# Patient Record
Sex: Male | Born: 1985 | Race: White | Hispanic: No | Marital: Single | Smoking: Current every day smoker
Health system: Southern US, Community
[De-identification: ages and names within clinical notes are randomized; demographics above are authoritative.]

## PROBLEM LIST (undated history)

## (undated) DIAGNOSIS — Z8701 Personal history of pneumonia (recurrent): Secondary | ICD-10-CM

## (undated) DIAGNOSIS — J45909 Unspecified asthma, uncomplicated: Secondary | ICD-10-CM

## (undated) DIAGNOSIS — H7090 Unspecified mastoiditis, unspecified ear: Secondary | ICD-10-CM

## (undated) DIAGNOSIS — J309 Allergic rhinitis, unspecified: Secondary | ICD-10-CM

## (undated) HISTORY — DX: Allergic rhinitis, unspecified: J30.9

## (undated) HISTORY — DX: Unspecified mastoiditis, unspecified ear: H70.90

## (undated) HISTORY — DX: Unspecified asthma, uncomplicated: J45.909

## (undated) HISTORY — DX: Personal history of pneumonia (recurrent): Z87.01

---

## 2011-12-10 ENCOUNTER — Emergency Department: Payer: Self-pay | Admitting: *Deleted

## 2013-03-24 ENCOUNTER — Emergency Department: Payer: Self-pay | Admitting: Emergency Medicine

## 2013-03-24 LAB — URINALYSIS, COMPLETE
Bilirubin,UR: NEGATIVE
Glucose,UR: NEGATIVE mg/dL (ref 0–75)
Ketone: NEGATIVE
Leukocyte Esterase: NEGATIVE
Ph: 7 (ref 4.5–8.0)
RBC,UR: NONE SEEN /HPF (ref 0–5)
Specific Gravity: 1.023 (ref 1.003–1.030)
Squamous Epithelial: NONE SEEN
WBC UR: NONE SEEN /HPF (ref 0–5)

## 2013-03-24 LAB — CBC
HGB: 16.8 g/dL (ref 13.0–18.0)
MCH: 33.4 pg (ref 26.0–34.0)
MCV: 95 fL (ref 80–100)
Platelet: 168 10*3/uL (ref 150–440)
RBC: 5.04 10*6/uL (ref 4.40–5.90)
RDW: 12.5 % (ref 11.5–14.5)
WBC: 5.8 10*3/uL (ref 3.8–10.6)

## 2013-03-24 LAB — COMPREHENSIVE METABOLIC PANEL
Albumin: 4.2 g/dL (ref 3.4–5.0)
Alkaline Phosphatase: 55 U/L (ref 50–136)
BUN: 15 mg/dL (ref 7–18)
Bilirubin,Total: 0.5 mg/dL (ref 0.2–1.0)
Calcium, Total: 8.5 mg/dL (ref 8.5–10.1)
Chloride: 104 mmol/L (ref 98–107)
Creatinine: 1.1 mg/dL (ref 0.60–1.30)
EGFR (Non-African Amer.): >60
Osmolality: 278 (ref 275–301)
Potassium: 3.9 mmol/L (ref 3.5–5.1)
SGOT(AST): 21 U/L (ref 15–37)
SGPT (ALT): 23 U/L (ref 12–78)
Sodium: 139 mmol/L (ref 136–145)
Total Protein: 7.4 g/dL (ref 6.4–8.2)

## 2013-06-25 ENCOUNTER — Ambulatory Visit: Payer: Self-pay | Admitting: Family Medicine

## 2013-06-25 ENCOUNTER — Other Ambulatory Visit: Payer: Self-pay | Admitting: Family Medicine

## 2013-06-25 LAB — SEDIMENTATION RATE: Erythrocyte Sed Rate: 1 mm/hr (ref 0–15)

## 2014-04-15 IMAGING — CT CT HEAD WITHOUT CONTRAST
1 series · 16 of 30 positions shown, 20 images · non-contrast
Comparison: none

REASON FOR EXAM: Call Report [id number]   unilateraL HEADACHE WITH MENTAL
CHANGES
COMMENTS:

[Series 2: soft tissue · axial · 0.47mm/px · z∈[+468,+618]mm · 16 of 34 slices shown, 20 images]
[im 2/34  brain]
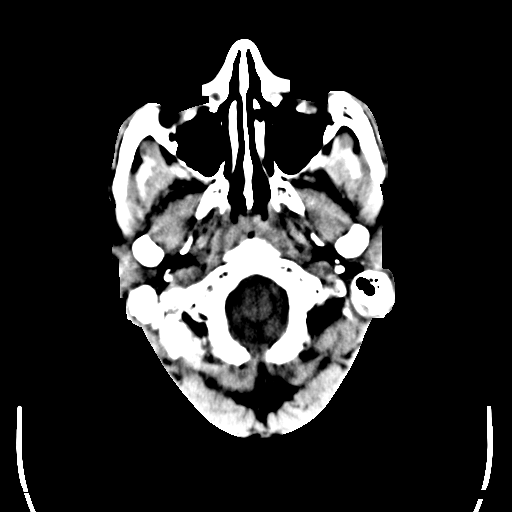
[im 2/34  bone]
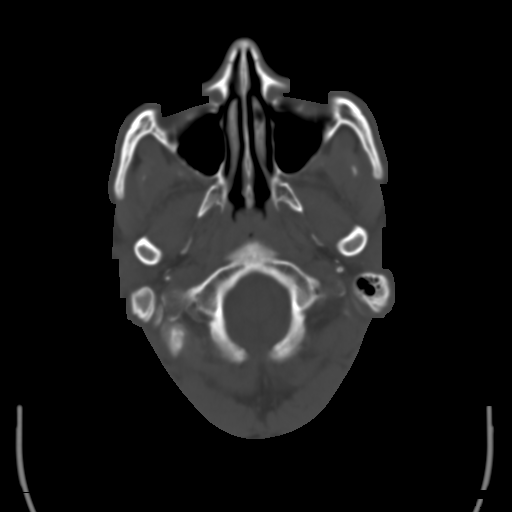
[im 4/34  brain]
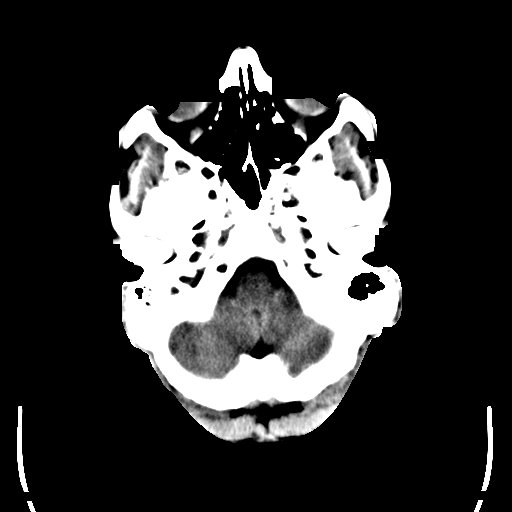
[im 6/34  brain]
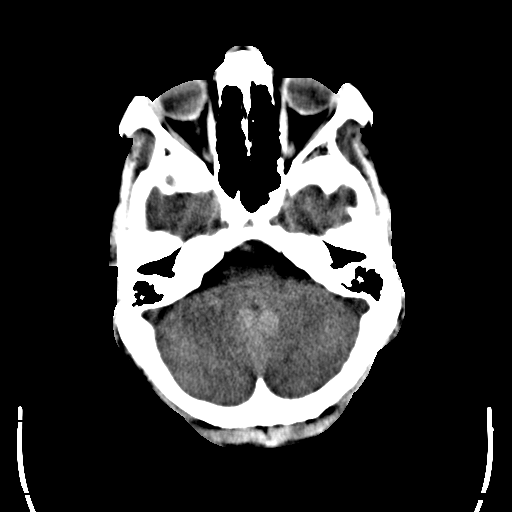
[im 8/34  brain]
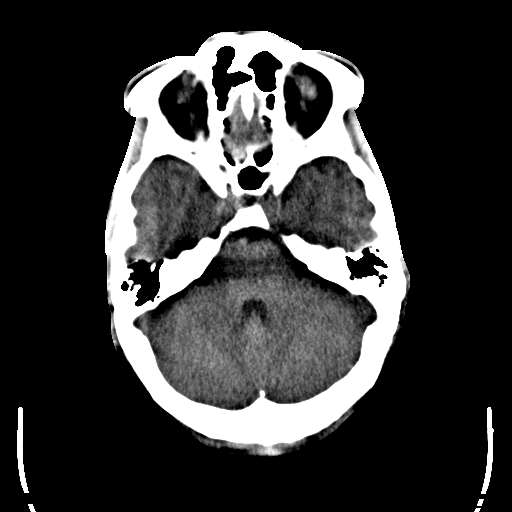
[im 10/34  brain]
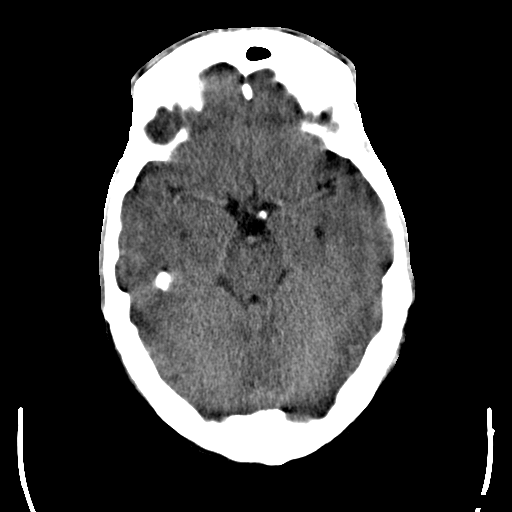
[im 10/34  bone]
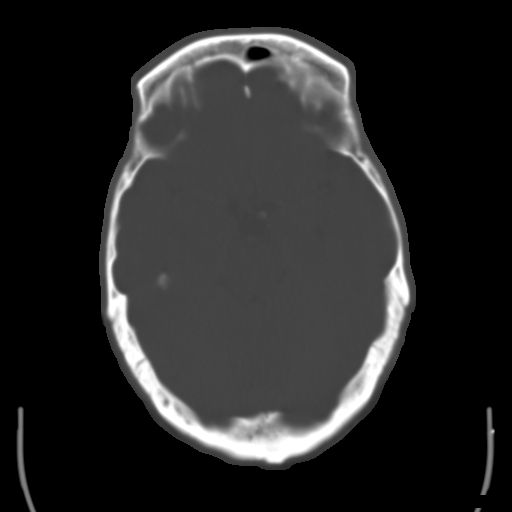
[im 12/34  brain]
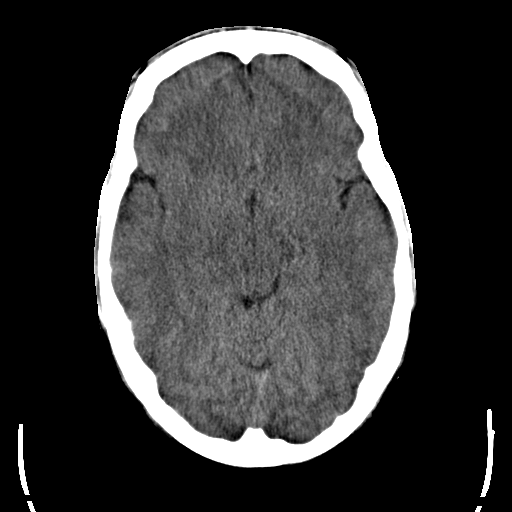
[im 14/34  brain]
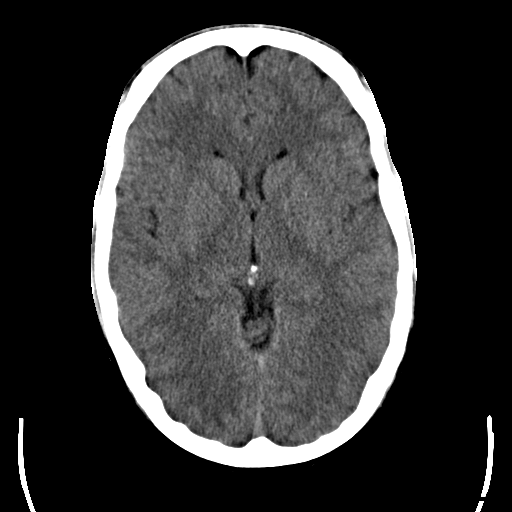
[im 16/34  brain]
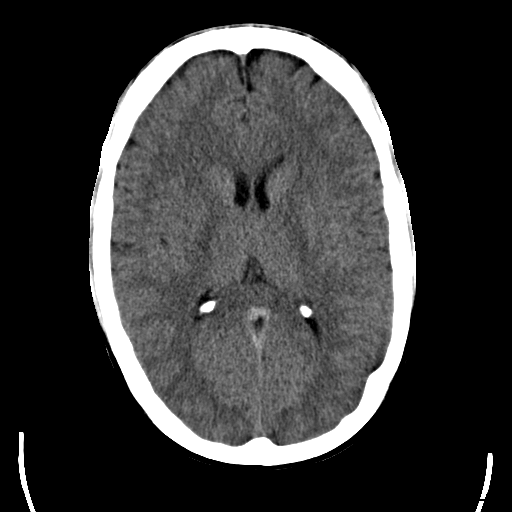
[im 18/34  brain]
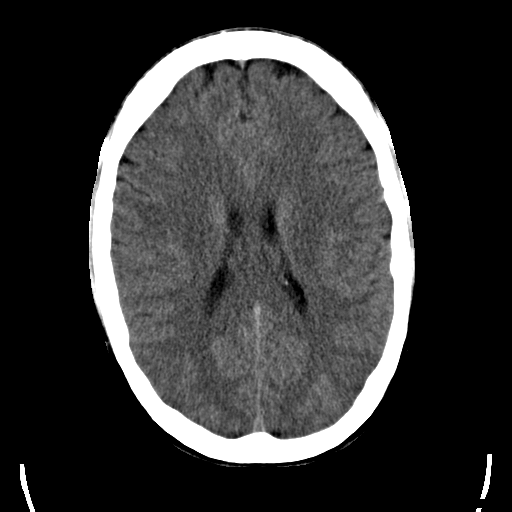
[im 18/34  bone]
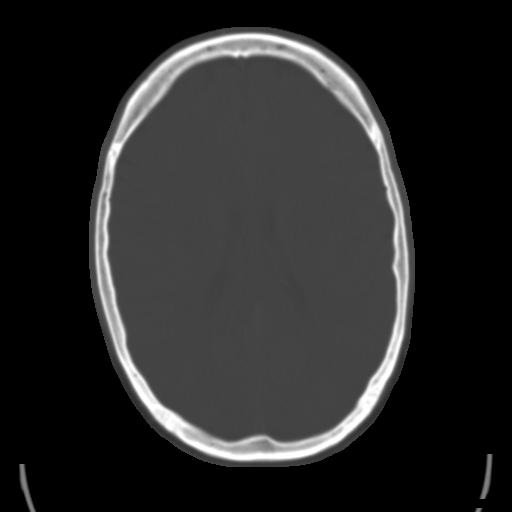
[im 20/34  brain]
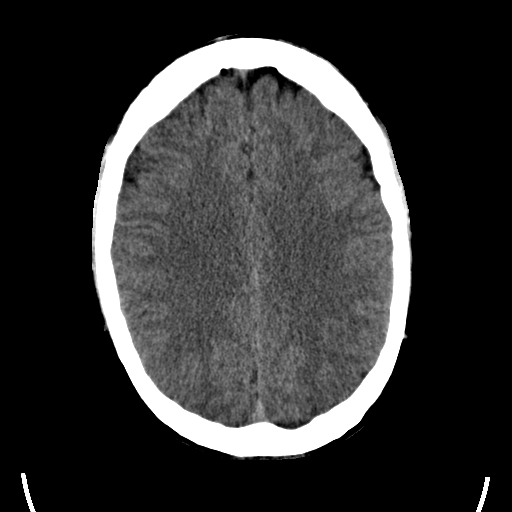
[im 22/34  brain]
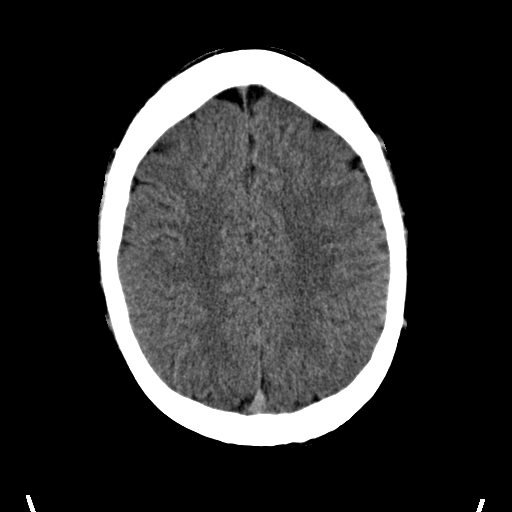
[im 24/34  brain]
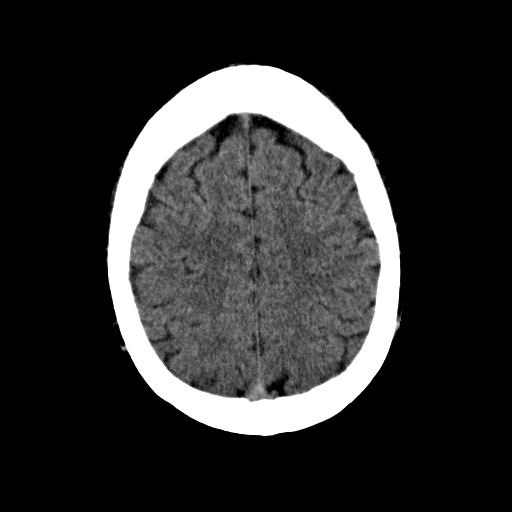
[im 26/34  brain]
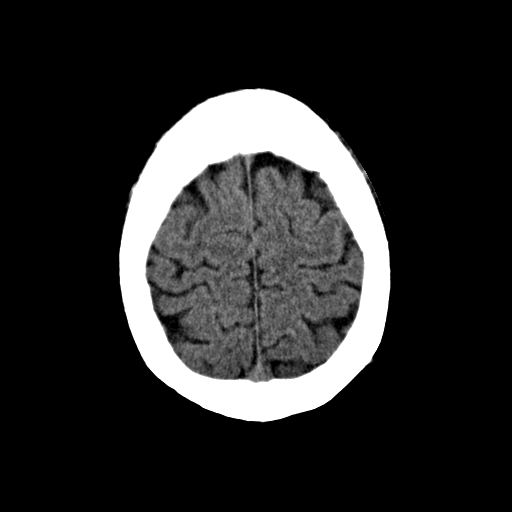
[im 26/34  bone]
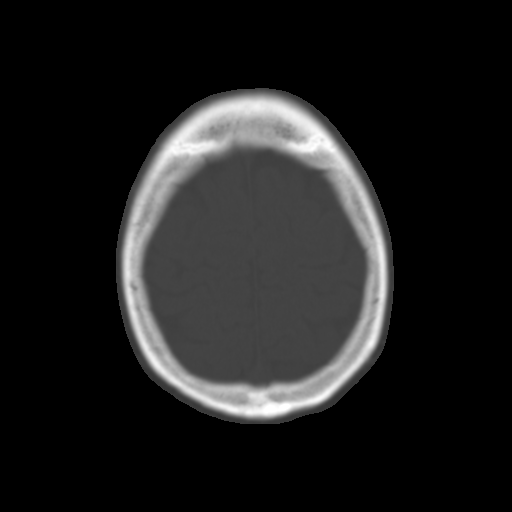
[im 28/34  brain]
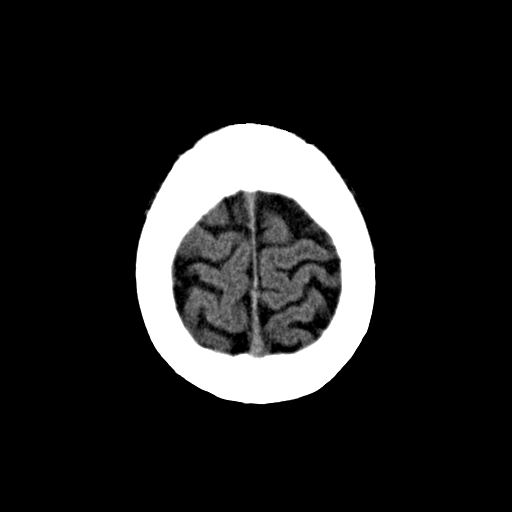
[im 30/34  brain]
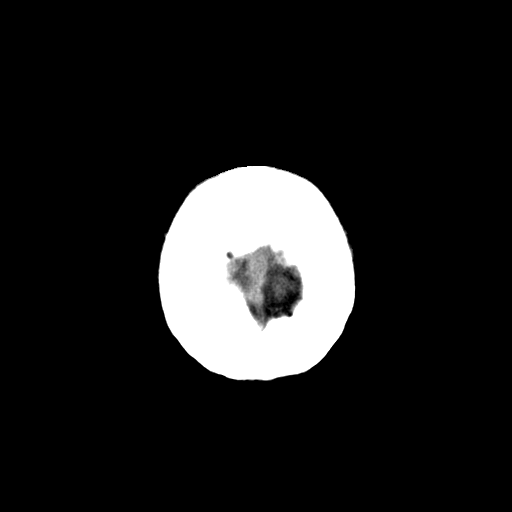
[im 32/34  brain]
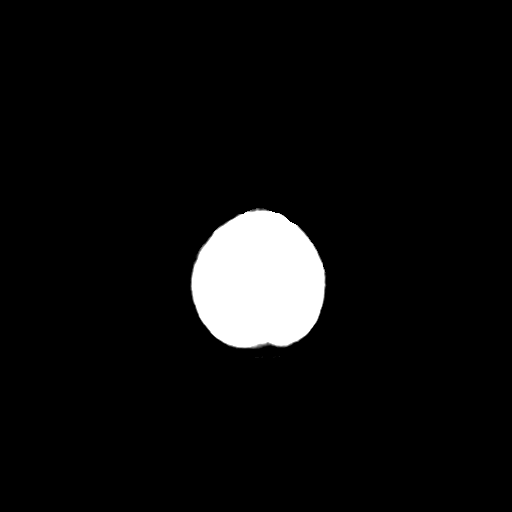

[16 of 30 positions shown; findings below may reference images not displayed]

PROCEDURE:     CT  - CT HEAD WITHOUT CONTRAST  - June 25, 2013  [DATE]

RESULT:     Axial noncontrast CT scanning was performed through the brain
with reconstructions at 5 mm intervals and slice thicknesses.

The ventricles are normal in size and position. There is no intracranial
hemorrhage nor intracranial mass effect. There is no evidence of an evolving
ischemic infarction. The cerebellum and brainstem are normal in density. At
bone window settings there is no evidence of an acute old fracture.
IMPRESSION: There is no acute intracranial abnormality.

[REDACTED]

## 2015-10-09 DIAGNOSIS — J309 Allergic rhinitis, unspecified: Secondary | ICD-10-CM | POA: Insufficient documentation

## 2015-10-13 ENCOUNTER — Ambulatory Visit: Payer: Self-pay | Admitting: Family Medicine

## 2015-10-20 ENCOUNTER — Ambulatory Visit: Payer: Self-pay | Admitting: Family Medicine

## 2016-07-19 ENCOUNTER — Emergency Department
Admission: EM | Admit: 2016-07-19 | Discharge: 2016-07-19 | Disposition: A | Discharge status: Home / Self Care | Payer: Self-pay | Attending: Emergency Medicine | Admitting: Emergency Medicine

## 2016-07-19 ENCOUNTER — Encounter: Payer: Self-pay | Admitting: Emergency Medicine

## 2016-07-19 ENCOUNTER — Emergency Department: Payer: Self-pay

## 2016-07-19 DIAGNOSIS — S93401A Sprain of unspecified ligament of right ankle, initial encounter: Secondary | ICD-10-CM

## 2016-07-19 DIAGNOSIS — Y999 Unspecified external cause status: Secondary | ICD-10-CM | POA: Insufficient documentation

## 2016-07-19 DIAGNOSIS — X501XXA Overexertion from prolonged static or awkward postures, initial encounter: Secondary | ICD-10-CM | POA: Insufficient documentation

## 2016-07-19 DIAGNOSIS — Y9344 Activity, trampolining: Secondary | ICD-10-CM | POA: Insufficient documentation

## 2016-07-19 DIAGNOSIS — F17200 Nicotine dependence, unspecified, uncomplicated: Secondary | ICD-10-CM | POA: Insufficient documentation

## 2016-07-19 DIAGNOSIS — J45909 Unspecified asthma, uncomplicated: Secondary | ICD-10-CM | POA: Insufficient documentation

## 2016-07-19 DIAGNOSIS — Y92830 Public park as the place of occurrence of the external cause: Secondary | ICD-10-CM | POA: Insufficient documentation

## 2016-07-19 MED ORDER — IBUPROFEN 600 MG PO TABS: 600.0000 mg | ORAL_TABLET | Freq: Four times a day (QID) | ORAL | Status: DC | PRN

## 2016-07-19 NOTE — ED Notes (Signed)
States he was jumping on a trampoline last pm and twisted right ankle  Positive swelling noted  Unable to bear wt  Positive pulses

## 2016-07-19 NOTE — ED Notes (Signed)
Reports jumping on trampoline yesterday and twisted right ankle.

## 2016-07-19 NOTE — ED Provider Notes (Signed)
[facility] Emergency Department Provider Note  ____________________________________________  Time seen: Approximately 10:09 AM  I have reviewed the triage vital signs and the nursing notes.   HISTORY  Chief Complaint Ankle Pain    HPI Cody Murphy is a 30 y.o. male , NAD, presents to the emergency department with one-day history of right ankle pain and swelling. Patient states he was at a trampoline park yesterday and twisted his right ankle while jumping. Had immediate swelling and pain about the right ankle and proximal foot. Has been treating with Biofreeze and Ace wrap and utilizing crutches at home. Notes that the swelling has gone down some since yesterday. Denies any numbness, weakness, tingling. Did not have any chest pain, shortness breath or visual changes to calls the injury. Denies head injury or trauma due to the injury. No open wounds or lacerations have been noted. Does have significant swelling and bruising about the right ankle and proximal foot.   Past Medical History  Diagnosis Date  . Asthma   . Allergic rhinitis   . Mastoiditis   . History of pneumonia     Patient Active Problem List   Diagnosis Date Noted  . Allergic rhinitis     History reviewed. No pertinent past surgical history.  Current Outpatient Rx  Name  Route  Sig  Dispense  Refill  . ibuprofen (ADVIL,MOTRIN) 600 MG tablet   Oral   Take 1 tablet (600 mg total) by mouth every 6 (six) hours as needed.   30 tablet   0     Allergies Augmentin; Ceclor; Penicillins; and Sulfa antibiotics  Family History  Problem Relation Age of Onset  . Hypertension Mother   . Thyroid disease Father     Social History Social History  Substance Use Topics  . Smoking status: Current Every Day Smoker  . Smokeless tobacco: Never Used  . Alcohol Use: None     Comment: occasional     Review of Systems  Constitutional: No fatigue Eyes: No visual changes. Cardiovascular: No  chest pain. Respiratory: No shortness of breath.  Musculoskeletal: Positive right ankle pain.  Skin: Positive swelling, bruising right ankle and proximal foot. Negative for rash, redness, open when she lacerations. Neurological: Negative for headaches, focal weakness or numbness. No tingling 10-point ROS otherwise negative.  ____________________________________________   PHYSICAL EXAM:  VITAL SIGNS: ED Triage Vitals  Enc Vitals Group     BP 07/19/16 0954 123/74 mmHg     Pulse Rate 07/19/16 0954 71     Resp 07/19/16 0954 18     Temp 07/19/16 0954 98.4 F (36.9 C)     Temp Source 07/19/16 0954 Oral     SpO2 07/19/16 0954 98 %     Weight 07/19/16 0954 187 lb (84.823 kg)     Height 07/19/16 0954 6\' 4"  (1.93 m)     Head Cir --      Peak Flow --      Pain Score 07/19/16 0944 3     Pain Loc --      Pain Edu? --      Excl. in GC? --      Constitutional: Alert and oriented. Well appearing and in no acute distress. Eyes: Conjunctivae are normal.  Head: Atraumatic. Cardiovascular: Normal rate, regular rhythm. Normal S1 and S2.  Good peripheral circulation with 2+ pulses noted in the right lower extremity. Capillary refill is brisk in all digits of the right foot. Respiratory: Normal respiratory effort without tachypnea  or retractions. Lungs CTAB with breath sounds noted in all lung fields. Gastrointestinal: Soft and nontender. No distention. No CVA tenderness. Musculoskeletal: Full range of motion of the right ankle but with pain with full extension. No laxity with anterior or posterior drawer. Full range of motion of the right toes without pain. Gait with a limp favoring the right. No lower extremity tenderness nor edema.  No joint effusions. Neurologic:  Normal speech and language. No gross focal neurologic deficits are appreciated. Sensation to light touch grossly intact about the right ankle, foot, toes. Skin:  Diffuse, severe swelling noted about the right ankle extending into the  proximal foot. Diffuse blue ecchymosis is noted through the area as well. Skin is warm, dry and intact. No rash, open wounds noted. Psychiatric: Mood and affect are normal. Speech and behavior are normal. Patient exhibits appropriate insight and judgement.   ____________________________________________   LABS  None ____________________________________________  EKG  None ____________________________________________  RADIOLOGY I have personally viewed and evaluated these images (plain radiographs) as part of my medical decision making, as well as reviewing the written report by the radiologist.  Dg Ankle Complete Right  07/19/2016  CLINICAL DATA:  Twisting injury last night on trampoline. EXAM: RIGHT ANKLE - COMPLETE 3+ VIEW COMPARISON:  None. FINDINGS: There is no evidence of fracture, dislocation, or joint effusion. There is no evidence of arthropathy or other focal bone abnormality. Generalized soft tissue swelling around the ankle. IMPRESSION: No acute osseous injury of the right ankle. Generalized soft tissue swelling around the right ankle. Electronically Signed   By: Elige Ko   On: 07/19/2016 10:26    ____________________________________________    PROCEDURES  Procedure(s) performed: None    Medications - No data to display   ____________________________________________   INITIAL IMPRESSION / ASSESSMENT AND PLAN / ED COURSE  Pertinent imaging results that were available during my care of the patient were reviewed by me and considered in my medical decision making (see chart for details).  Patient's diagnosis is consistent with right ankle sprain. Patient will continue to wrap the ankle and an Ace wrap and utilize the crutches to assist with ambulation. Advisese ice to the affected area 20 minutes 3-4 times daily and to keep the right lower extremity elevated when not ambulating. Patient will be discharged home with prescriptions for ibuprofen to take as directed.  Patient is to follow up with Dr. Martha Clan in orthopedics in 1 week if symptoms persist past this treatment course. Patient is given ED precautions to return to the ED for any worsening or new symptoms.    ____________________________________________  FINAL CLINICAL IMPRESSION(S) / ED DIAGNOSES  Final diagnoses:  Right ankle sprain, initial encounter      NEW MEDICATIONS STARTED DURING THIS VISIT:  Discharge Medication List as of 07/19/2016 10:42 AM    START taking these medications   Details  ibuprofen (ADVIL,MOTRIN) 600 MG tablet Take 1 tablet (600 mg total) by mouth every 6 (six) hours as needed., Starting 07/19/2016, Until Discontinued, Print             Hope Pigeon, PA-C 07/19/16 1104  Jennye Moccasin, MD 07/19/16 1118

## 2016-07-19 NOTE — Discharge Instructions (Signed)
Ankle Sprain °An ankle sprain is an injury to the strong, fibrous tissues (ligaments) that hold the bones of your ankle joint together.  °CAUSES °An ankle sprain is usually caused by a fall or by twisting your ankle. Ankle sprains most commonly occur when you step on the outer edge of your foot, and your ankle turns inward. People who participate in sports are more prone to these types of injuries.  °SYMPTOMS  °· Pain in your ankle. The pain may be present at rest or only when you are trying to stand or walk. °· Swelling. °· Bruising. Bruising may develop immediately or within 1 to 2 days after your injury. °· Difficulty standing or walking, particularly when turning corners or changing directions. °DIAGNOSIS  °Your caregiver will ask you details about your injury and perform a physical exam of your ankle to determine if you have an ankle sprain. During the physical exam, your caregiver will press on and apply pressure to specific areas of your foot and ankle. Your caregiver will try to move your ankle in certain ways. An X-ray exam may be done to be sure a bone was not broken or a ligament did not separate from one of the bones in your ankle (avulsion fracture).  °TREATMENT  °Certain types of braces can help stabilize your ankle. Your caregiver can make a recommendation for this. Your caregiver may recommend the use of medicine for pain. If your sprain is severe, your caregiver may refer you to a surgeon who helps to restore function to parts of your skeletal system (orthopedist) or a physical therapist. °HOME CARE INSTRUCTIONS  °· Apply ice to your injury for 1-2 days or as directed by your caregiver. Applying ice helps to reduce inflammation and pain. °· Put ice in a plastic bag. °· Place a towel between your skin and the bag. °· Leave the ice on for 15-20 minutes at a time, every 2 hours while you are awake. °· Only take over-the-counter or prescription medicines for pain, discomfort, or fever as directed by  your caregiver. °· Elevate your injured ankle above the level of your heart as much as possible for 2-3 days. °· If your caregiver recommends crutches, use them as instructed. Gradually put weight on the affected ankle. Continue to use crutches or a cane until you can walk without feeling pain in your ankle. °· If you have a plaster splint, wear the splint as directed by your caregiver. Do not rest it on anything harder than a pillow for the first 24 hours. Do not put weight on it. Do not get it wet. You may take it off to take a shower or bath. °· You may have been given an elastic bandage to wear around your ankle to provide support. If the elastic bandage is too tight (you have numbness or tingling in your foot or your foot becomes cold and blue), adjust the bandage to make it comfortable. °· If you have an air splint, you may blow more air into it or let air out to make it more comfortable. You may take your splint off at night and before taking a shower or bath. Wiggle your toes in the splint several times per day to decrease swelling. °SEEK MEDICAL CARE IF:  °· You have rapidly increasing bruising or swelling. °· Your toes feel extremely cold or you lose feeling in your foot. °· Your pain is not relieved with medicine. °SEEK IMMEDIATE MEDICAL CARE IF: °· Your toes are numb or blue. °·   You have severe pain that is increasing. °MAKE SURE YOU:  °· Understand these instructions. °· Will watch your condition. °· Will get help right away if you are not doing well or get worse. °  °This information is not intended to replace advice given to you by your health care provider. Make sure you discuss any questions you have with your health care provider. °  °Document Released: 12/16/2005 Document Revised: 01/06/2015 Document Reviewed: 12/28/2011 °Elsevier Interactive Patient Education ©2016 [employer] ° °Elastic Bandage and RICE °WHAT DOES AN ELASTIC BANDAGE DO? °Elastic bandages come in different shapes and sizes.  They generally provide support to your injury and reduce swelling while you are healing, but they can perform different functions. Your health care provider will help you to decide what is best for your protection, recovery, or rehabilitation following an injury. °WHAT ARE SOME GENERAL TIPS FOR USING AN ELASTIC BANDAGE? °· Use the bandage as directed by the maker of the bandage that you are using. °· Do not wrap the bandage too tightly. This may cut off the circulation in the arm or leg in the area below the bandage. °¨ If part of your body beyond the bandage becomes blue, numb, cold, swollen, or is more painful, your bandage is most likely too tight. If this occurs, remove your bandage and reapply it more loosely. °· See your health care provider if the bandage seems to be making your problems worse rather than better. °· An elastic bandage should be removed and reapplied every 3-4 hours or as directed by your health care provider. °WHAT IS RICE? °The routine care of many injuries includes rest, ice, compression, and elevation (RICE therapy).  °Rest °Rest is required to allow your body to heal. Generally, you can resume your routine activities when you are comfortable and have been given permission by your health care provider. °Ice °Icing your injury helps to keep the swelling down and it reduces pain. Do not apply ice directly to your skin. °· Put ice in a plastic bag. °· Place a towel between your skin and the bag. °· Leave the ice on for 20 minutes, 2-3 times per day. °Do this for as long as you are directed by your health care provider. °Compression °Compression helps to keep swelling down, gives support, and helps with discomfort. Compression may be done with an elastic bandage. °Elevation °Elevation helps to reduce swelling and it decreases pain. If possible, your injured area should be placed at or above the level of your heart or the center of your chest. °WHEN SHOULD I SEEK MEDICAL CARE? °You should seek  medical care if: °· You have persistent pain and swelling. °· Your symptoms are getting worse rather than improving. °These symptoms may indicate that further evaluation or further X-rays are needed. Sometimes, X-rays may not show a small broken bone (fracture) until a number of days later. Make a follow-up appointment with your health care provider. Ask when your X-ray results will be ready. Make sure that you get your X-ray results. °WHEN SHOULD I SEEK IMMEDIATE MEDICAL CARE? °You should seek immediate medical care if: °· You have a sudden onset of severe pain at or below the area of your injury. °· You develop redness or increased swelling around your injury. °· You have tingling or numbness at or below the area of your injury that does not improve after you remove the elastic bandage. °  °This information is not intended to replace advice given to you by your   health care provider. Make sure you discuss any questions you have with your health care provider. °  °Document Released: 06/07/2002 Document Revised: 09/06/2015 Document Reviewed: 08/01/2014 °Elsevier Interactive Patient Education ©2016 Elsevier Inc. ° °

## 2019-07-31 ENCOUNTER — Telehealth: Payer: Self-pay | Admitting: Physician Assistant

## 2019-07-31 DIAGNOSIS — Z20822 Contact with and (suspected) exposure to covid-19: Secondary | ICD-10-CM

## 2019-07-31 NOTE — Progress Notes (Signed)
I have spent 5 minutes in review of e-visit questionnaire, review and updating patient chart, medical decision making and response to patient.   Ollie Esty Cody Clayburn Weekly, PA-C    

## 2019-07-31 NOTE — Progress Notes (Signed)
E-Visit for Corona Virus Screening   Your current symptoms could be consistent with the coronavirus.  Many health care providers can now test patients at their office but not all are.  [facility] has multiple testing sites. For information on our COVID testing locations and hours go to [email or web address]  Please quarantine yourself while awaiting your test results.  We are enrolling you in our MyChart Home Montioring for COVID19 . Daily you will receive a questionnaire within the MyChart website. Our COVID 19 response team willl be monitoriing your responses daily.    COVID-19 is a respiratory illness with symptoms that are similar to the flu. Symptoms are typically mild to moderate, but there have been cases of severe illness and death due to the virus. The following symptoms may appear 2-14 days after exposure: . Fever . Cough . Shortness of breath or difficulty breathing . Chills . Repeated shaking with chills . Muscle pain . Headache . Sore throat . New loss of taste or smell . Fatigue . Congestion or runny nose . Nausea or vomiting . Diarrhea  It is vitally important that if you feel that you have an infection such as this virus or any other virus that you stay home and away from places where you may spread it to others.  You should self-quarantine for 14 days if you have symptoms that could potentially be coronavirus or have been in close contact a with a person diagnosed with COVID-19 within the last 2 weeks. You should avoid contact with people age 65 and older.   You should wear a mask or cloth face covering over your nose and mouth if you must be around other people or animals, including pets (even at home). Try to stay at least 6 feet away from other people. This will protect the people around you.  You may also take acetaminophen (Tylenol) as needed for fever.   Reduce your risk of any infection by using the same precautions used for avoiding the  common cold or flu:  . Wash your hands often with soap and warm water for at least 20 seconds.  If soap and water are not readily available, use an alcohol-based hand sanitizer with at least 60% alcohol.  . If coughing or sneezing, cover your mouth and nose by coughing or sneezing into the elbow areas of your shirt or coat, into a tissue or into your sleeve (not your hands). . Avoid shaking hands with others and consider head nods or verbal greetings only. . Avoid touching your eyes, nose, or mouth with unwashed hands.  . Avoid close contact with people who are sick. . Avoid places or events with large numbers of people in one location, like concerts or sporting events. . Carefully consider travel plans you have or are making. . If you are planning any travel outside or inside the [place], visit the CDC's Travelers' Health webpage for the latest health notices. . If you have some symptoms but not all symptoms, continue to monitor at home and seek medical attention if your symptoms worsen. . If you are having a medical emergency, call 911.  HOME CARE . Only take medications as instructed by your medical team. . Drink plenty of fluids and get plenty of rest. . A steam or ultrasonic humidifier can help if you have congestion.   GET HELP RIGHT AWAY IF YOU HAVE EMERGENCY WARNING SIGNS** FOR COVID-19. If you or someone is showing any of these signs seek emergency medical care immediately. Call   911 or proceed to your closest emergency facility if: . You develop worsening high fever. . Trouble breathing . Bluish lips or face . Persistent pain or pressure in the chest . New confusion . Inability to wake or stay awake . You cough up blood. . Your symptoms become more severe  **This list is not all possible symptoms. Contact your medical provider for any symptoms that are sever or concerning to you.   MAKE SURE YOU   Understand these instructions.  Will watch your condition.  Will get help right  away if you are not doing well or get worse.  Your e-visit answers were reviewed by a board certified advanced clinical practitioner to complete your personal care plan.  Depending on the condition, your plan could have included both over the counter or prescription medications.  If there is a problem please reply once you have received a response from your provider.  Your safety is important to us.  If you have drug allergies check your prescription carefully.    You can use MyChart to ask questions about today's visit, request a non-urgent call back, or ask for a work or school excuse for 24 hours related to this e-Visit. If it has been greater than 24 hours you will need to follow up with your provider, or enter a new e-Visit to address those concerns. You will get an e-mail in the next two days asking about your experience.  I hope that your e-visit has been valuable and will speed your recovery. Thank you for using e-visits.    

## 2019-08-10 ENCOUNTER — Emergency Department
Admission: EM | Admit: 2019-08-10 | Discharge: 2019-08-10 | Disposition: A | Discharge status: Home / Self Care | Payer: Self-pay | Attending: Emergency Medicine | Admitting: Emergency Medicine

## 2019-08-10 ENCOUNTER — Emergency Department: Payer: Self-pay

## 2019-08-10 ENCOUNTER — Encounter: Payer: Self-pay | Admitting: Emergency Medicine

## 2019-08-10 ENCOUNTER — Other Ambulatory Visit: Payer: Self-pay

## 2019-08-10 DIAGNOSIS — Z87891 Personal history of nicotine dependence: Secondary | ICD-10-CM | POA: Insufficient documentation

## 2019-08-10 DIAGNOSIS — J209 Acute bronchitis, unspecified: Secondary | ICD-10-CM | POA: Insufficient documentation

## 2019-08-10 MED ORDER — DOXYCYCLINE HYCLATE 100 MG PO CAPS
100.0000 mg | ORAL_CAPSULE | Freq: Two times a day (BID) | ORAL | 0 refills | Status: AC
Start: 2019-08-10 — End: ?

## 2019-08-10 MED ORDER — PREDNISONE 10 MG PO TABS: ORAL_TABLET | ORAL | 0 refills | Status: AC

## 2019-08-10 NOTE — Discharge Instructions (Addendum)
Follow-up with your primary care provider or can no clinic acute care if any continued problems.  Begin taking antibiotic twice a day for the next 10 days.  The prednisone is a tapered dose starting with 6 tablets today and tapering down by 1 tablet each day until finished.  Also avoid being in the sun directly while taking the antibiotic as you could blister easily.

## 2019-08-10 NOTE — ED Provider Notes (Signed)
[facility] Emergency Department Provider Note   ____________________________________________   First MD Initiated Contact with Patient 08/10/19 [phone]     (approximate)  I have reviewed the triage vital signs and the nursing notes.   HISTORY  Chief Complaint Cough   HPI Cody Murphy is a 33 y.o. male presents to the ED with complaint of productive cough for the last 2 weeks.  Patient states that he stopped smoking approximately 3 weeks ago and began using a vaping pen which he believes is when he began coughing.  Since that time he is discontinued using the vaping pen.  He states he has been smoking 1 1/2 pk per day for the last 16 years.  He denies any known fever.  He is unaware of any exposure to COVID.  Appetite and taste and smell are unaffected.  Patient has positive history of pneumonia and negative history for asthma.  He denies any pain at this time.       Past Medical History:  Diagnosis Date  . Allergic rhinitis   . Asthma   . History of pneumonia   . Mastoiditis     Patient Active Problem List   Diagnosis Date Noted  . Allergic rhinitis     History reviewed. No pertinent surgical history.  Prior to Admission medications   Medication Sig Start Date End Date Taking? Authorizing Provider  doxycycline (VIBRAMYCIN) 100 MG capsule Take 1 capsule (100 mg total) by mouth 2 (two) times daily. 08/10/19   Tommi RumpsSummers, Rhonda L, PA-C  predniSONE (DELTASONE) 10 MG tablet Take 6 tablets  today, on day 2 take 5 tablets, day 3 take 4 tablets, day 4 take 3 tablets, day 5 take  2 tablets and 1 tablet the last day 08/10/19   Tommi RumpsSummers, Rhonda L, PA-C    Allergies Augmentin [amoxicillin-pot clavulanate], Ceclor [cefaclor], Penicillins, and Sulfa antibiotics  Family History  Problem Relation Age of Onset  . Hypertension Mother   . Thyroid disease Father     Social History Social History   Tobacco Use  . Smoking status: Current Every Day Smoker  .  Smokeless tobacco: Never Used  Substance Use Topics  . Alcohol use: Not on file    Comment: occasional  . Drug use: No    Review of Systems Constitutional: No fever/chills Eyes: No visual changes. ENT: No sore throat. Cardiovascular: Denies chest pain. Respiratory: Denies shortness of breath.  Positive for productive cough. Gastrointestinal: No abdominal pain.  No nausea, no vomiting.  No diarrhea.  No constipation. Musculoskeletal: Negative for back pain. Skin: Negative for rash. Neurological: Negative for headaches, focal weakness or numbness. ____________________________________________   PHYSICAL EXAM:  VITAL SIGNS: ED Triage Vitals  Enc Vitals Group     BP 08/10/19 0715 124/81     Pulse Rate 08/10/19 0715 62     Resp 08/10/19 0715 18     Temp 08/10/19 0715 98.3 F (36.8 C)     Temp Source 08/10/19 0715 Oral     SpO2 08/10/19 0715 97 %     Weight 08/10/19 0705 170 lb (77.1 kg)     Height 08/10/19 0705 6\' 4"  (1.93 m)     Head Circumference --      Peak Flow --      Pain Score 08/10/19 0705 0     Pain Loc --      Pain Edu? --      Excl. in GC? --    Constitutional: Alert and  oriented. Well appearing and in no acute distress. Eyes: Conjunctivae are normal.  Head: Atraumatic. Nose: No congestion/rhinnorhea. Mouth/Throat: Mucous membranes are moist.  Oropharynx non-erythematous. Neck: No stridor.   Hematological/Lymphatic/Immunilogical: No cervical lymphadenopathy. Cardiovascular: Normal rate, regular rhythm. Grossly normal heart sounds.  Good peripheral circulation. Respiratory: Normal respiratory effort.  No retractions. Lungs CTAB. Musculoskeletal: Moves upper and lower extremities without difficulty.  Normal gait was noted. Neurologic:  Normal speech and language. No gross focal neurologic deficits are appreciated. No gait instability. Skin:  Skin is warm, dry and intact. No rash noted. Psychiatric: Mood and affect are normal. Speech and behavior are normal.   ____________________________________________   LABS (all labs ordered are listed, but only abnormal results are displayed)  Labs Reviewed - No data to display  RADIOLOGY   Official radiology report(s): Dg Chest 2 View  Result Date: 08/10/2019 CLINICAL DATA:  Cough for a week.  No fever. EXAM: CHEST - 2 VIEW COMPARISON:  March 24, 2013 FINDINGS: The heart size and mediastinal contours are within normal limits. Both lungs are clear. The visualized skeletal structures are unremarkable. IMPRESSION: No active cardiopulmonary disease. Electronically Signed   By: Dorise Bullion III M.D   On: 08/10/2019 08:22    ____________________________________________   PROCEDURES  Procedure(s) performed (including Critical Care):  Procedures  ____________________________________________   INITIAL IMPRESSION / ASSESSMENT AND PLAN / ED COURSE  As part of my medical decision making, I reviewed the following data within the electronic MEDICAL RECORD NUMBER Notes from prior ED visits and [place] Controlled Substance Database  33 year old male presents to the ED with complaint of productive cough for the last 2 weeks.  He denies any known fever or chills.  He is unaware of any exposure to COVID.  Patient has been a 1-1/2 pack cigarette per day smoker until approximately 3 weeks ago which time he discontinued smoking and started using a vape pen.  He states that his cough got worse after using the vape pen.  He denies any previous history of asthma but had a positive history for pneumonia.  Chest x-ray was reassuring.  Patient is being treated for acute bronchitis with doxycycline and a prednisone taper.  COVID was in the differential diagnosis however patient does not have any symptoms other than a cough and was afebrile.   ____________________________________________   FINAL CLINICAL IMPRESSION(S) / ED DIAGNOSES  Final diagnoses:  Acute bronchitis, unspecified organism  History of smoking 10-25 pack years      ED Discharge Orders         Ordered    doxycycline (VIBRAMYCIN) 100 MG capsule  2 times daily     08/10/19 0833    predniSONE (DELTASONE) 10 MG tablet     08/10/19 9678           Note:  This document was prepared using Dragon voice recognition software and may include unintentional dictation errors.    Johnn Hai, PA-C 08/10/19 9381    Lavonia Drafts, MD 08/10/19 1321

## 2019-08-10 NOTE — ED Triage Notes (Addendum)
Pt presents to ED via POV with c/o cough x 2 weeks. Pt states today started coughing up blood in mucous. Pt states mucous is light green. Pt states noticed about "50/50" blood and mucous. Pt states increasingly worse cough x several days. Pt speaking in complete sentences without difficulty, respirations even and unlabored. Pt denies clots in sputum.

## 2019-08-10 NOTE — ED Notes (Signed)
See triage note   Presents with cough for about 2 weeks   States he noticed some blood with mucous this morning   Denies any fever   States he has tried OTC meds for cough with some relief

## 2020-05-30 IMAGING — CR CHEST - 2 VIEW
1 series · 2 of 2 positions shown · non-contrast
Comparison: March 24, 2013

CLINICAL DATA: Cough for a week.  No fever.

EXAM:
CHEST - 2 VIEW

[Series 1: dg chest 2 view · 0.14mm/px · 2 of 2 slices shown]
[im 1/2]
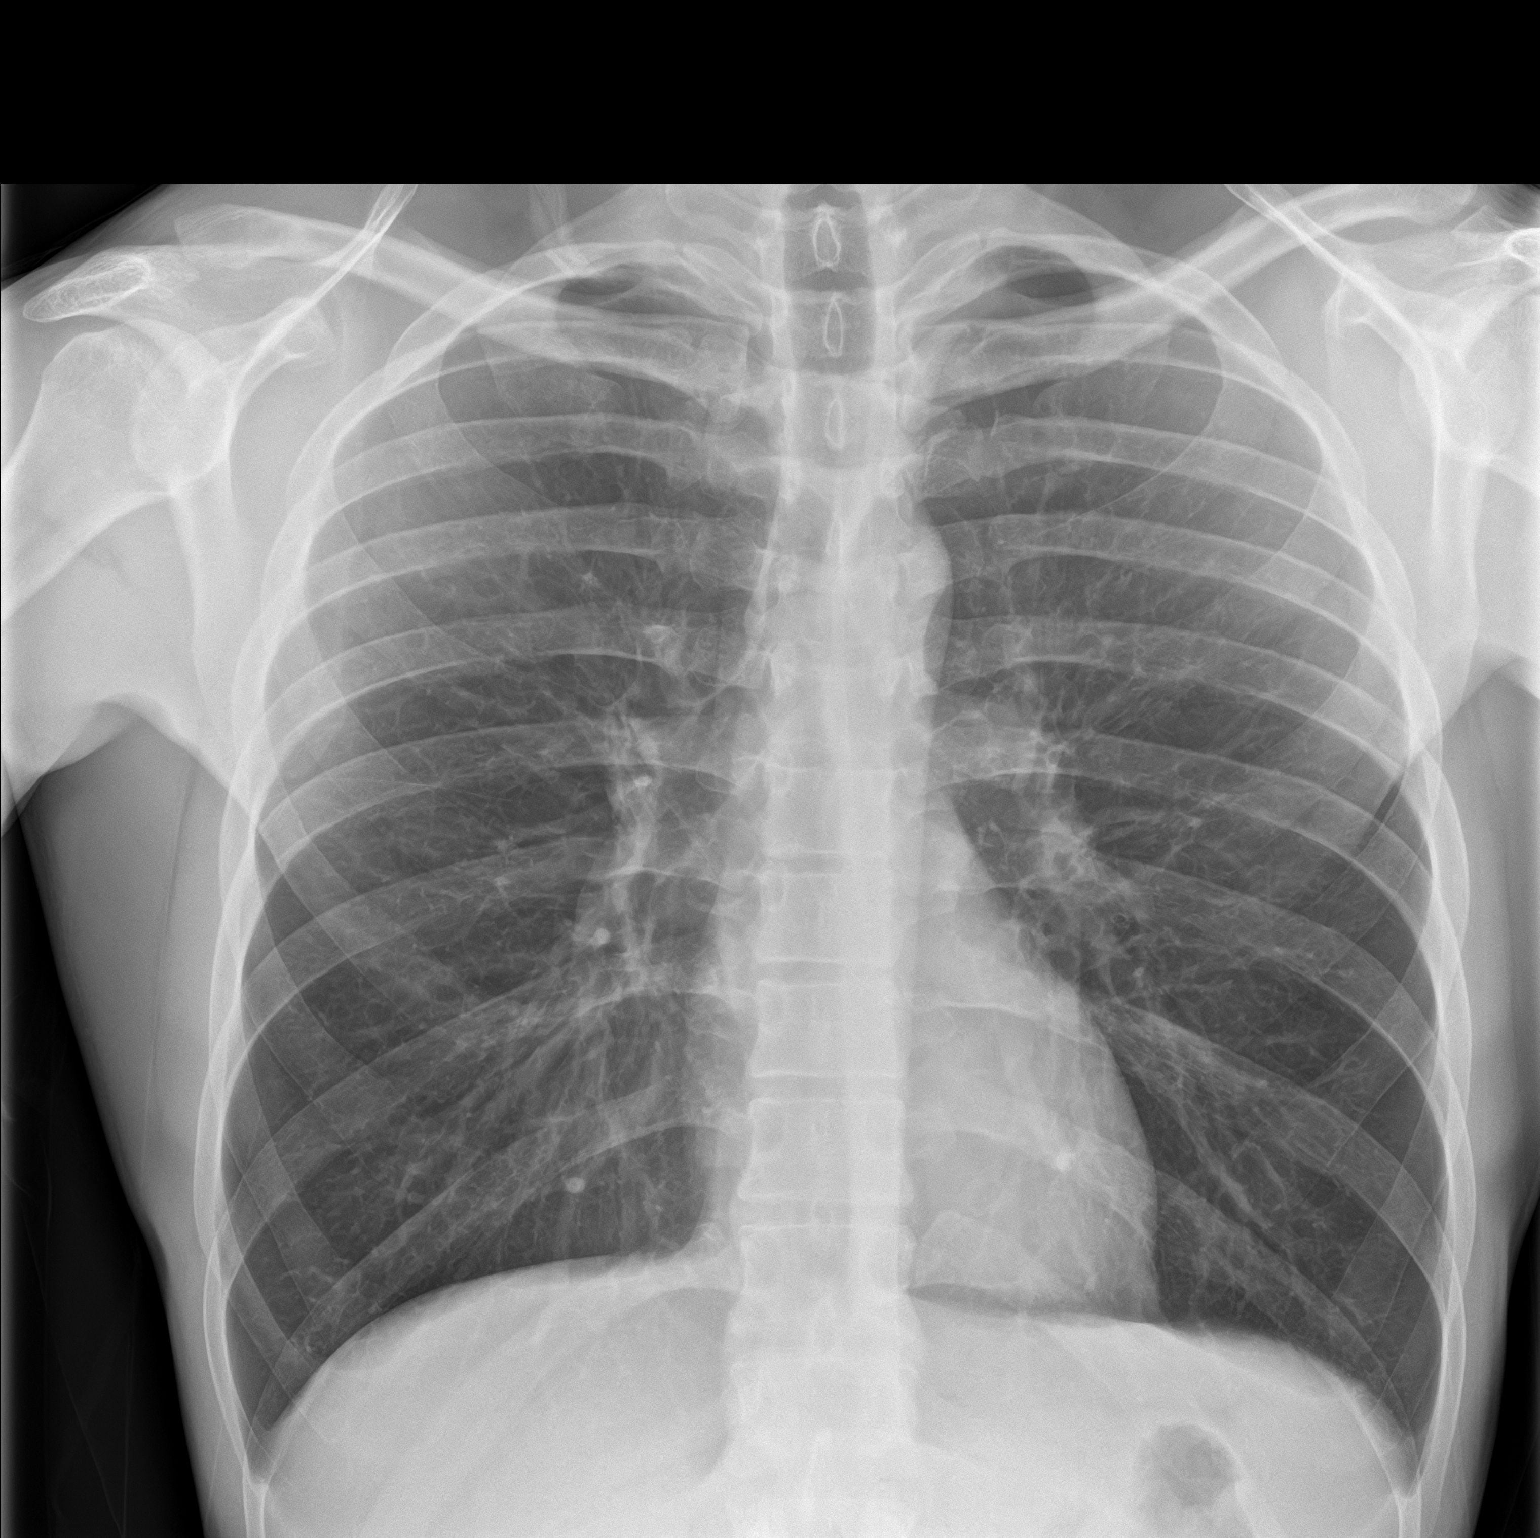
[im 2/2]
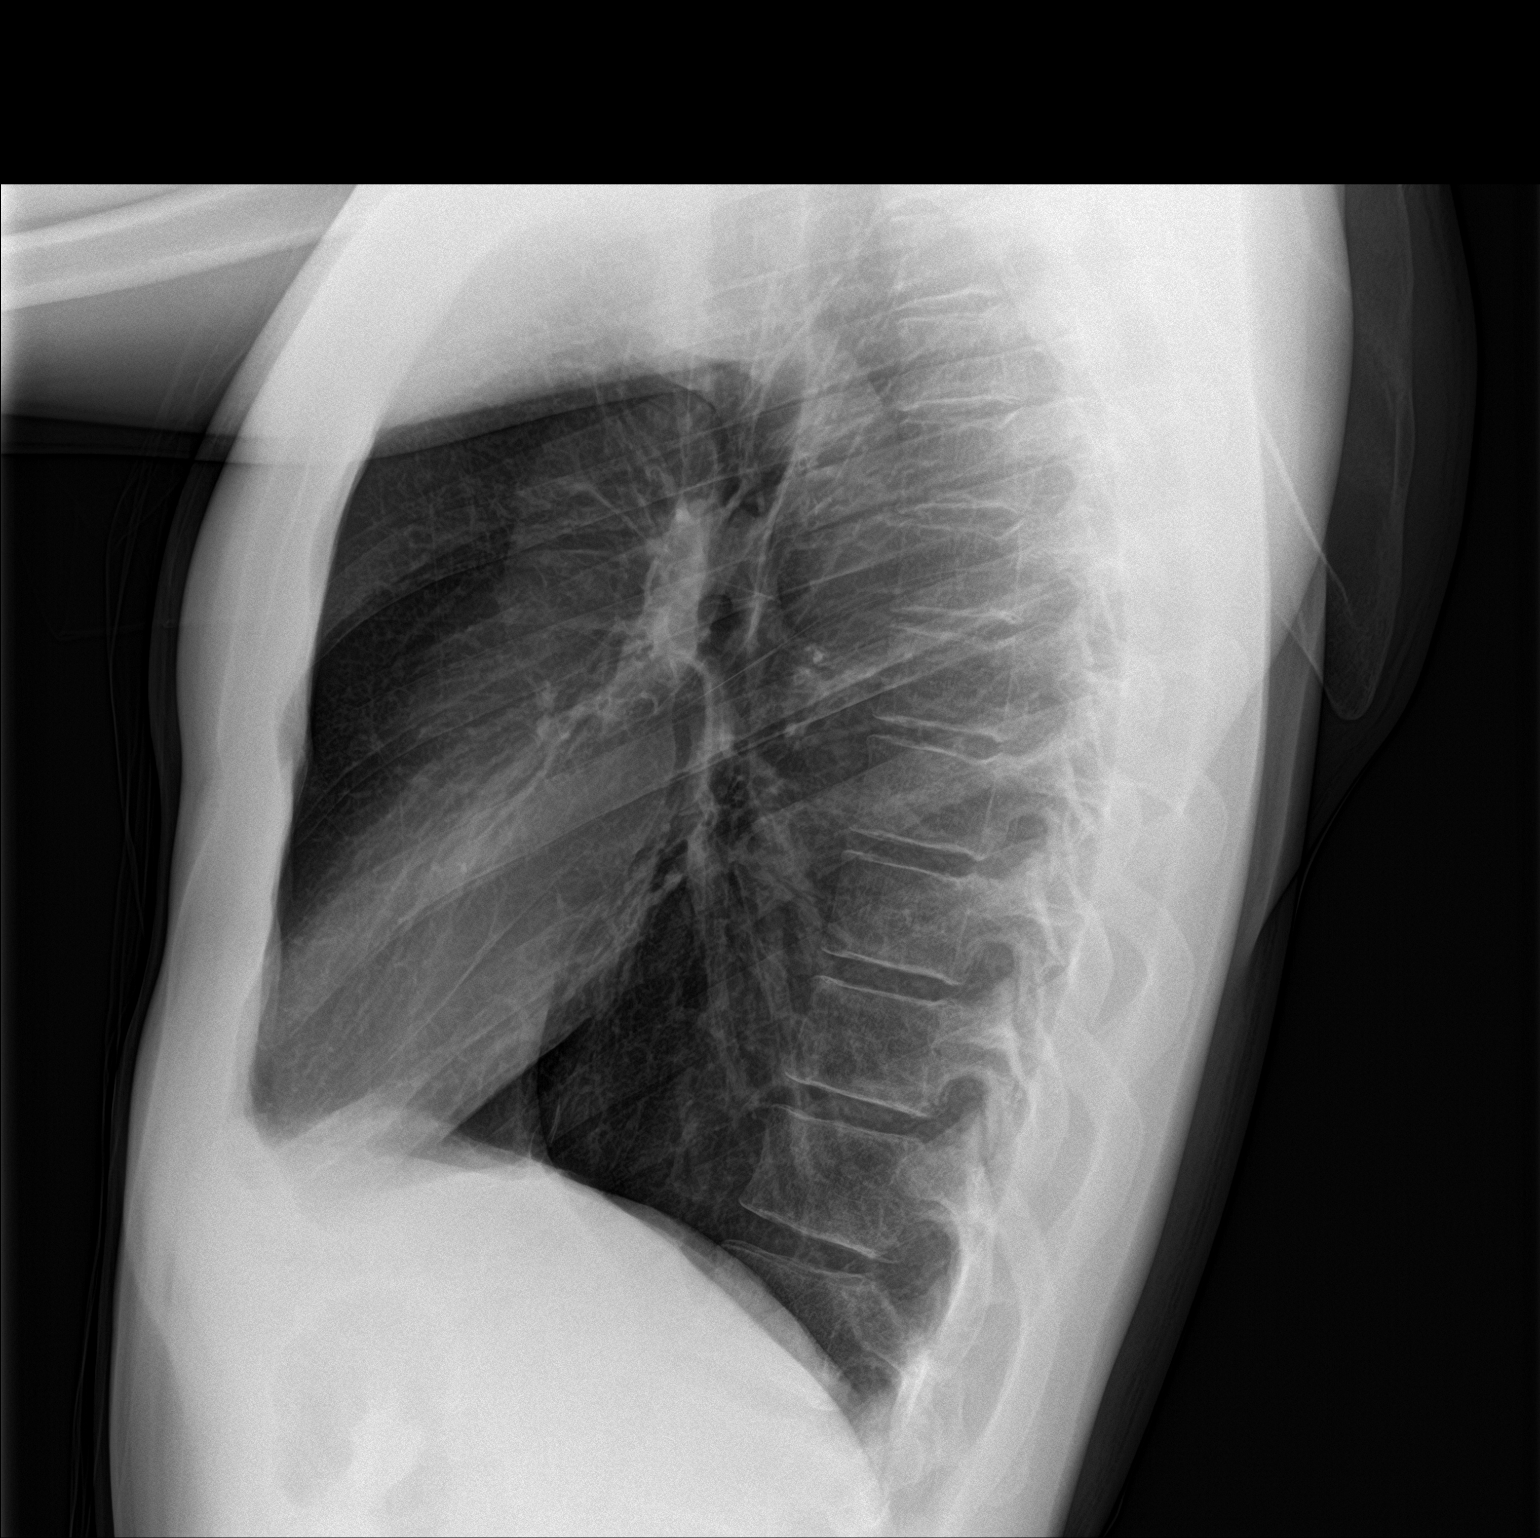

[2 of 2 positions shown; findings below may reference images not displayed]

FINDINGS: The heart size and mediastinal contours are within normal limits.
Both lungs are clear. The visualized skeletal structures are
unremarkable.
IMPRESSION: No active cardiopulmonary disease.
